# Patient Record
Sex: Female | Born: 1980 | Race: Black or African American | Hispanic: No | Marital: Single | Smoking: Never smoker
Health system: Southern US, Community
[De-identification: ages and names within clinical notes are randomized; demographics above are authoritative.]

---

## 2004-06-22 ENCOUNTER — Emergency Department (HOSPITAL_COMMUNITY): Admission: EM | Admit: 2004-06-22 | Discharge: 2004-06-22 | Payer: Self-pay | Admitting: Emergency Medicine
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
## 2013-04-11 ENCOUNTER — Encounter (HOSPITAL_COMMUNITY): Payer: Self-pay

## 2013-04-11 ENCOUNTER — Emergency Department (HOSPITAL_COMMUNITY): Payer: No Typology Code available for payment source

## 2013-04-11 ENCOUNTER — Emergency Department (HOSPITAL_COMMUNITY)
Admission: EM | Admit: 2013-04-11 | Discharge: 2013-04-11 | Disposition: A | Discharge status: Home / Self Care | Payer: No Typology Code available for payment source | Attending: Emergency Medicine | Admitting: Emergency Medicine

## 2013-04-11 DIAGNOSIS — S4980XA Other specified injuries of shoulder and upper arm, unspecified arm, initial encounter: Secondary | ICD-10-CM | POA: Insufficient documentation

## 2013-04-11 DIAGNOSIS — S46909A Unspecified injury of unspecified muscle, fascia and tendon at shoulder and upper arm level, unspecified arm, initial encounter: Secondary | ICD-10-CM | POA: Insufficient documentation

## 2013-04-11 DIAGNOSIS — Y9389 Activity, other specified: Secondary | ICD-10-CM | POA: Insufficient documentation

## 2013-04-11 DIAGNOSIS — Y92410 Unspecified street and highway as the place of occurrence of the external cause: Secondary | ICD-10-CM | POA: Insufficient documentation

## 2013-04-11 DIAGNOSIS — S8990XA Unspecified injury of unspecified lower leg, initial encounter: Secondary | ICD-10-CM | POA: Insufficient documentation

## 2013-04-11 DIAGNOSIS — S8991XA Unspecified injury of right lower leg, initial encounter: Secondary | ICD-10-CM

## 2013-04-11 DIAGNOSIS — Z880 Allergy status to penicillin: Secondary | ICD-10-CM | POA: Insufficient documentation

## 2013-04-11 MED ORDER — CYCLOBENZAPRINE HCL 5 MG PO TABS: 5.0000 mg | ORAL_TABLET | Freq: Two times a day (BID) | ORAL | Status: DC | PRN

## 2013-04-11 MED ORDER — ONDANSETRON 4 MG PO TBDP
4.0000 mg | ORAL_TABLET | Freq: Once | ORAL | Status: AC
  Administered 2013-04-11: 4 mg via ORAL
  Filled 2013-04-11: qty 1

## 2013-04-11 MED ORDER — HYDROCODONE-ACETAMINOPHEN 5-325 MG PO TABS
2.0000 | ORAL_TABLET | Freq: Once | ORAL | Status: DC
  Filled 2013-04-11: qty 2

## 2013-04-11 MED ORDER — HYDROCODONE-ACETAMINOPHEN 5-325 MG PO TABS
1.0000 | ORAL_TABLET | ORAL | Status: AC
  Administered 2013-04-11: 1 via ORAL

## 2013-04-11 MED ORDER — HYDROCODONE-ACETAMINOPHEN 5-325 MG PO TABS: 1.0000 | ORAL_TABLET | ORAL | Status: DC | PRN
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
## 2013-04-11 NOTE — ED Provider Notes (Signed)
Medical screening examination/treatment/procedure(s) were performed by non-physician practitioner and as supervising physician I was immediately available for consultation/collaboration.   William Maliq Pilley, MD 04/11/13 1829 

## 2013-06-21 ENCOUNTER — Emergency Department (INDEPENDENT_AMBULATORY_CARE_PROVIDER_SITE_OTHER)
Admission: EM | Admit: 2013-06-21 | Discharge: 2013-06-21 | Disposition: A | Discharge status: Home / Self Care | Payer: Self-pay | Source: Home / Self Care | Attending: Emergency Medicine | Admitting: Emergency Medicine

## 2013-06-21 ENCOUNTER — Encounter (HOSPITAL_COMMUNITY): Payer: Self-pay | Admitting: Emergency Medicine

## 2013-06-21 DIAGNOSIS — H10211 Acute toxic conjunctivitis, right eye: Secondary | ICD-10-CM

## 2013-06-21 DIAGNOSIS — H10219 Acute toxic conjunctivitis, unspecified eye: Secondary | ICD-10-CM

## 2013-06-21 MED ORDER — POLYETHYL GLYCOL-PROPYL GLYCOL 0.4-0.3 % OP SOLN: OPHTHALMIC | Status: DC
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
## 2013-06-21 NOTE — ED Notes (Signed)
Accessed notes for patient phone call.

## 2013-06-21 NOTE — ED Provider Notes (Signed)
Chief Complaint:   Chief Complaint  Patient presents with  . Eye Problem    History of Present Illness:   Sandy Weeks is a 32 year old female who splashed some alcohol hand sanitizing gel into both of her eyes yesterday. The right eye got the largest amount of the hand sanitizing gel. She was emptying the hand sanitizing gel from one bottle to the other, when the bottle cracked and it sprayed into her eyes. She noted immediate burning in both of her eyes and there was redness and tearing. This is gradually improved over the past 24 hours. She denies any changes in her vision. She called poison control was told to flush copiously with tap water which she did and apply moist compresses. She feels better today, although her right eye is still mildly irritated. There has been no purulent drainage.  Review of Systems:  Other than noted above, the patient denies any of the following symptoms: Systemic:  No fever, chills, sweats, fatigue, or weight loss. Eye:  No redness, eye pain, photophobia, discharge, blurred vision, or diplopia. ENT:  No nasal congestion, rhinorrhea, or sore throat. Lymphatic:  No adenopathy. Skin:  No rash or pruritis.  PMFSH:  Past medical history, family history, social history, meds, and allergies were reviewed.  She is allergic to penicillin.  Physical Exam:   Vital signs:  BP 115/65  Pulse 68  Temp(Src) 98.8 F (37.1 C) (Oral)  Resp 16  SpO2 100%  LMP 05/22/2013 General:  Alert and in no distress. Eye:  Lids and conjunctiva appear normal. There was no foreign body. No purulent drainage. Cornea was intact to fluorescein staining, anterior chamber was normal. PERRLA, full EOMs, fundi were benign. ENT:  TMs and canals clear.  Nasal mucosa normal.  No intra-oral lesions, mucous membranes moist, pharynx clear. Neck:  No adenopathy tenderness or mass. Skin:  Clear, warm and dry.  Assessment:  The encounter diagnosis was Chemical conjunctivitis of right eye.  Does not  appear to be any damage or injury to the eye, and this should heal up well.  Plan:   1.  Meds:  The following meds were prescribed:   Discharge Medication List as of 06/21/2013  1:02 PM    START taking these medications   Details  Polyethyl Glycol-Propyl Glycol (SYSTANE) 0.4-0.3 % SOLN 1 drop in right eye every 3 hours while awake, Normal        2.  Patient Education/Counseling:  The patient was given appropriate handouts, self care instructions, and instructed in symptomatic relief.   3.  Follow up:  The patient was told to follow up if no better in 3 to 4 days, if becoming worse in any way, and given some red flag symptoms such as worsening redness, pain, or changes in her vision which would prompt immediate return.  Follow up here as needed. It should be noted that the patient also stated that the x-ray tech who took her vital signs did not apply a thermometer cover when she took her temperature. The patient was told this was not a standard practice, that the employer will be counseled, and that she should watch out for any signs of infectious disease such as upper respiratory symptoms, sore throat, or oral lesions.      Reuben Likes, MD 06/21/13 [phone]

## 2013-06-27 ENCOUNTER — Emergency Department (INDEPENDENT_AMBULATORY_CARE_PROVIDER_SITE_OTHER)
Admission: EM | Admit: 2013-06-27 | Discharge: 2013-06-27 | Disposition: A | Discharge status: Home / Self Care | Payer: Self-pay | Source: Home / Self Care | Attending: Emergency Medicine | Admitting: Emergency Medicine

## 2013-06-27 ENCOUNTER — Telehealth (HOSPITAL_COMMUNITY): Payer: Self-pay

## 2013-06-27 ENCOUNTER — Encounter (HOSPITAL_COMMUNITY): Payer: Self-pay | Admitting: Emergency Medicine

## 2013-06-27 DIAGNOSIS — J069 Acute upper respiratory infection, unspecified: Secondary | ICD-10-CM

## 2013-06-27 DIAGNOSIS — J209 Acute bronchitis, unspecified: Secondary | ICD-10-CM

## 2013-06-27 LAB — POCT RAPID STREP A: Streptococcus, Group A Screen (Direct): NEGATIVE

## 2013-06-27 MED ORDER — NAPROXEN 500 MG PO TABS: 500.0000 mg | ORAL_TABLET | Freq: Two times a day (BID) | ORAL | Status: DC

## 2013-06-27 MED ORDER — BENZONATATE 200 MG PO CAPS: 200.0000 mg | ORAL_CAPSULE | Freq: Three times a day (TID) | ORAL | Status: DC | PRN

## 2013-06-27 MED ORDER — AZITHROMYCIN 250 MG PO TABS: ORAL_TABLET | ORAL | Status: DC

## 2013-06-27 NOTE — ED Provider Notes (Signed)
Chief Complaint:   Chief Complaint  Patient presents with  . URI    History of Present Illness:   Sandy Weeks is a 32 year old female who was here on December 8 because she is by some hand sanitizing gel into her eyes. The eyes were mildly irritated. On eye examination her eyes appeared completely normal. On her visit that night, vital signs were taken. The person who took her temperature, used a thermometer probe without a probe cover. The patient noticed this immediately and reported this to me. It's uncertain whether the person who preceded her had had his temperature taken without the probe cover. The person who took the temperature was informed of this. I told the patient that I did not think there was anything that could be done about it at this point, but she should be on the lookout for any evidence of infection. One to 2 days after her appointment here, she developed cough productive yellow-green sputum, nasal congestion with clear drainage, sinus pressure, sore throat, hoarseness, and she felt warm and nauseated. She denies difficulty breathing, headache, fever, vomiting, or diarrhea. She denies any sores inside her mouth.  Review of Systems:  Other than noted above, the patient denies any of the following symptoms: Systemic:  No fevers, chills, sweats, weight loss or gain, fatigue, or tiredness. Eye:  No redness or discharge. ENT:  No ear pain, drainage, headache, nasal congestion, drainage, sinus pressure, difficulty swallowing, or sore throat. Neck:  No neck pain or swollen glands. Lungs:  No cough, sputum production, hemoptysis, wheezing, chest tightness, shortness of breath or chest pain. GI:  No abdominal pain, nausea, vomiting or diarrhea.  PMFSH:  Past medical history, family history, social history, meds, and allergies were reviewed. She is allergic to penicillin.  Physical Exam:   Vital signs:  BP 128/83  Pulse 102  Temp(Src) 99.5 F (37.5 C) (Oral)  Resp 16  SpO2 100%   LMP 06/06/2013 General:  Alert and oriented.  In no distress.  Skin warm and dry. Eye:  No conjunctival injection or drainage. Lids were normal. ENT:  TMs and canals were normal, without erythema or inflammation.  Nasal mucosa was clear and uncongested, without drainage.  Mucous membranes were moist.  Pharynx was moderately erythematous with no exudate or drainage.  There were no oral ulcerations or lesions. Neck:  Supple, no adenopathy, tenderness or mass. Lungs:  No respiratory distress.  Lungs were clear to auscultation, without wheezes, rales or rhonchi.  Breath sounds were clear and equal bilaterally.  Heart:  Regular rhythm, without gallops, murmers or rubs. Skin:  Clear, warm, and dry, without rash or lesions.  Labs:   Results for orders placed during the hospital encounter of 06/27/13  POCT RAPID STREP A ([facility] ONLY)      Result Value Range   Streptococcus, Group A Screen (Direct) NEGATIVE  NEGATIVE    Assessment:  The primary encounter diagnosis was Viral upper respiratory infection. A diagnosis of Acute bronchitis was also pertinent to this visit.  This viral upper respiratory infection could have been due to exposure to a thermometer probe which was not covered with a plastic probe cover. Alternatively this could be unrelated to that episode and could be due to some other exposure as well. I told the patient that I could not be sure which it was. A safety zone portal was filled out. The person who took her vital signs at her last visit has been counseled about the proper way to  take a temperature. The patient will not be charged for this visit. I encouraged her to return if there any new symptoms or if she should fail to get better within one week.  Plan:   1.  Meds:  The following meds were prescribed:   Discharge Medication List as of 06/27/2013 12:37 PM    START taking these medications   Details  azithromycin (ZITHROMAX Z-PAK) 250 MG tablet Take as directed., Normal     benzonatate (TESSALON) 200 MG capsule Take 1 capsule (200 mg total) by mouth 3 (three) times daily as needed for cough., Starting 06/27/2013, Until Discontinued, Normal    naproxen (NAPROSYN) 500 MG tablet Take 1 tablet (500 mg total) by mouth 2 (two) times daily., Starting 06/27/2013, Until Discontinued, Normal        2.  Patient Education/Counseling:  The patient was given appropriate handouts, self care instructions, and instructed in symptomatic relief.   3.  Follow up:  The patient was told to follow up if no better in 3 to 4 days, if becoming worse in any way, and given some red flag symptoms such as fever or worsening symptoms which would prompt immediate return.  Follow up here as necessary.      Reuben Likes, MD 06/27/13 2012
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
## 2013-06-27 NOTE — ED Notes (Signed)
C/o cold sx States she has fever, coughing up yellow mucous, sore throat

## 2013-06-29 LAB — CULTURE, GROUP A STREP

## 2013-07-16 ENCOUNTER — Emergency Department (INDEPENDENT_AMBULATORY_CARE_PROVIDER_SITE_OTHER)
Admission: EM | Admit: 2013-07-16 | Discharge: 2013-07-16 | Disposition: A | Discharge status: Home / Self Care | Payer: Self-pay | Source: Home / Self Care | Attending: Family Medicine | Admitting: Family Medicine

## 2013-07-16 ENCOUNTER — Encounter (HOSPITAL_COMMUNITY): Payer: Self-pay | Admitting: Emergency Medicine

## 2013-07-16 DIAGNOSIS — B349 Viral infection, unspecified: Secondary | ICD-10-CM

## 2013-07-16 DIAGNOSIS — J029 Acute pharyngitis, unspecified: Secondary | ICD-10-CM

## 2013-07-16 DIAGNOSIS — B9789 Other viral agents as the cause of diseases classified elsewhere: Secondary | ICD-10-CM

## 2013-07-16 LAB — POCT RAPID STREP A: Streptococcus, Group A Screen (Direct): NEGATIVE

## 2013-07-16 NOTE — ED Provider Notes (Signed)
CSN: [id number]     Arrival date & time 07/16/13  1230 History   First MD Initiated Contact with Patient 07/16/13 1429     Chief Complaint  Patient presents with  . Sore Throat   (Consider location/radiation/quality/duration/timing/severity/associated sxs/prior Treatment) Patient is a 33 y.o. female presenting with URI. The history is provided by the patient.  URI Presenting symptoms: congestion, fever, rhinorrhea and sore throat   Presenting symptoms: no cough   Severity:  Mild Onset quality:  Gradual Duration:  5 days Progression:  Unchanged Chronicity:  New Ineffective treatments:  OTC medications Associated symptoms: myalgias   Associated symptoms: no swollen glands   Risk factors: sick contacts     History reviewed. No pertinent past medical history. History reviewed. No pertinent past surgical history. History reviewed. No pertinent family history. History  Substance Use Topics  . Smoking status: Never Smoker   . Smokeless tobacco: Not on file  . Alcohol Use: Yes     Comment: occassional   OB History   Grav Para Term Preterm Abortions TAB SAB Ect Mult Living                 Review of Systems  Constitutional: Positive for fever.  HENT: Positive for congestion, rhinorrhea and sore throat.   Respiratory: Negative for cough.   Musculoskeletal: Positive for myalgias.    Allergies  Penicillins  Home Medications   Current Outpatient Rx  Name  Route  Sig  Dispense  Refill  . azithromycin (ZITHROMAX Z-PAK) 250 MG tablet      Take as directed.   6 tablet   0   . benzonatate (TESSALON) 200 MG capsule   Oral   Take 1 capsule (200 mg total) by mouth 3 (three) times daily as needed for cough.   30 capsule   0   . cyclobenzaprine (FLEXERIL) 5 MG tablet   Oral   Take 1 tablet (5 mg total) by mouth 2 (two) times daily as needed for muscle spasms.   20 tablet   0   . HYDROcodone-acetaminophen (NORCO/VICODIN) 5-325 MG per tablet   Oral   Take 1 tablet by  mouth every 4 (four) hours as needed for pain.   12 tablet   0   . naproxen (NAPROSYN) 500 MG tablet   Oral   Take 1 tablet (500 mg total) by mouth 2 (two) times daily.   30 tablet   0   . Polyethyl Glycol-Propyl Glycol (SYSTANE) 0.4-0.3 % SOLN      1 drop in right eye every 3 hours while awake   19 mL   0    BP 119/82  Pulse 82  Temp(Src) 97.8 F (36.6 C) (Oral)  Resp 14  SpO2 100%  LMP 06/06/2013 Physical Exam  Nursing note and vitals reviewed. Constitutional: She is oriented to person, place, and time. Vital signs are normal. She appears well-developed and well-nourished.  HENT:  Head: Normocephalic.  Right Ear: External ear normal.  Left Ear: External ear normal.  Mouth/Throat: Oropharynx is clear and moist.    Eyes: Pupils are equal, round, and reactive to light.  Neck: Normal range of motion. Neck supple.  Pulmonary/Chest: Effort normal and breath sounds normal.  Lymphadenopathy:    She has no cervical adenopathy.  Neurological: She is alert and oriented to person, place, and time.  Skin: Skin is warm and dry.    ED Course  Procedures (including critical care time) Labs Review Labs Reviewed  POCT RAPID STREP  A ([facility] ONLY)   Imaging Review No results found.  EKG Interpretation    Date/Time:    Ventricular Rate:    PR Interval:    QRS Duration:   QT Interval:    QTC Calculation:   R Axis:     Text Interpretation:              MDM      Linna Hoff, MD 07/16/13 [phone]

## 2013-07-16 NOTE — Discharge Instructions (Signed)
Drink plenty of fluids as discussed,  and mucinex or delsym for cough. Return or see your doctor if further problems °
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
## 2013-07-18 LAB — CULTURE, GROUP A STREP
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
## 2020-04-17 ENCOUNTER — Other Ambulatory Visit: Payer: Self-pay

## 2020-11-08 ENCOUNTER — Encounter (HOSPITAL_COMMUNITY): Payer: Self-pay

## 2020-11-08 ENCOUNTER — Other Ambulatory Visit: Payer: Self-pay

## 2020-11-08 ENCOUNTER — Ambulatory Visit (HOSPITAL_COMMUNITY)
Admission: EM | Admit: 2020-11-08 | Discharge: 2020-11-08 | Disposition: A | Discharge status: Home / Self Care | Payer: Self-pay | Attending: Medical Oncology | Admitting: Medical Oncology

## 2020-11-08 DIAGNOSIS — J069 Acute upper respiratory infection, unspecified: Secondary | ICD-10-CM

## 2020-11-08 MED ORDER — BENZONATATE 100 MG PO CAPS: 100.0000 mg | ORAL_CAPSULE | Freq: Three times a day (TID) | ORAL | 0 refills | Status: DC

## 2020-11-08 MED ORDER — FLUTICASONE PROPIONATE 50 MCG/ACT NA SUSP: 2.0000 | Freq: Every day | NASAL | 0 refills | Status: DC

## 2020-11-08 NOTE — ED Triage Notes (Signed)
Pt c/o coughing, sneezing, nasal drainage, fever, loss of appetite and ringing in the ears X 6 days. Pt states she had a fever for 5 days.

## 2020-11-08 NOTE — ED Provider Notes (Signed)
[facility]    CSN: [id number] Arrival date & time: 11/08/20  1739      History   Chief Complaint Chief Complaint  Patient presents with  . Cough  . Nasal Congestion  . Fever    HPI Sapna Padron is a 40 y.o. female.   HPI  Cough: Patient reports symptoms of coughing, sneezing, nasal drainage, fever, loss of appetite and some occasional tinnitus in the ears for the past 6 days.  She states that she has had fevers for 5 days that have resolved finally. No chest pains, SOB, loss of taste/smell, diarrhea. Has had loss of appetite and a few speckles of blood in mucous after coughing fits but has not had any within the last 24 hours and is a non-smoker. She denies any known sick contacts. She reports that she needs COVID-19 testing for her return to work.    History reviewed. No pertinent past medical history.  There are no problems to display for this patient.   History reviewed. No pertinent surgical history.  OB History   No obstetric history on file.      Home Medications    Prior to Admission medications   Medication Sig Start Date End Date Taking? Authorizing Provider  azithromycin (ZITHROMAX Z-PAK) 250 MG tablet Take as directed. 06/27/13   Reuben Likes, MD  benzonatate (TESSALON) 200 MG capsule Take 1 capsule (200 mg total) by mouth 3 (three) times daily as needed for cough. 06/27/13   Reuben Likes, MD  cyclobenzaprine (FLEXERIL) 5 MG tablet Take 1 tablet (5 mg total) by mouth 2 (two) times daily as needed for muscle spasms. 04/11/13   Marlon Pel, PA-C  HYDROcodone-acetaminophen (NORCO/VICODIN) 5-325 MG per tablet Take 1 tablet by mouth every 4 (four) hours as needed for pain. 04/11/13   Marlon Pel, PA-C  naproxen (NAPROSYN) 500 MG tablet Take 1 tablet (500 mg total) by mouth 2 (two) times daily. 06/27/13   Reuben Likes, MD  Polyethyl Glycol-Propyl Glycol (SYSTANE) 0.4-0.3 % SOLN 1 drop in right eye every 3 hours while awake 06/21/13    Reuben Likes, MD    Family History History reviewed. No pertinent family history.  Social History Social History   Tobacco Use  . Smoking status: Never Smoker  . Smokeless tobacco: Never Used  Substance Use Topics  . Alcohol use: Yes    Comment: occassional  . Drug use: No     Allergies   Penicillins   Review of Systems Review of Systems  As stated above in HPI Physical Exam Triage Vital Signs ED Triage Vitals  Enc Vitals Group     BP 11/08/20 1834 109/62     Pulse Rate 11/08/20 1834 83     Resp 11/08/20 1834 18     Temp 11/08/20 1834 98.1 F (36.7 C)     Temp Source 11/08/20 1834 Oral     SpO2 11/08/20 1834 100 %     Weight --      Height --      Head Circumference --      Peak Flow --      Pain Score 11/08/20 1831 6     Pain Loc --      Pain Edu? --      Excl. in GC? --    No data found.  Updated Vital Signs BP 109/62 (BP Location: Right Arm)   Pulse 83   Temp 98.1 F (36.7 C) (Oral)   Resp  18   LMP 11/08/2020 (Exact Date)   SpO2 100%   Physical Exam Vitals and nursing note reviewed.  Constitutional:      General: She is not in acute distress.    Appearance: Normal appearance. She is not ill-appearing, toxic-appearing or diaphoretic.  HENT:     Head: Normocephalic and atraumatic.     Right Ear: Tympanic membrane, ear canal and external ear normal.     Left Ear: Tympanic membrane, ear canal and external ear normal.     Nose: Rhinorrhea present.     Mouth/Throat:     Mouth: Mucous membranes are dry.     Pharynx: No oropharyngeal exudate or posterior oropharyngeal erythema.  Eyes:     Extraocular Movements: Extraocular movements intact.     Pupils: Pupils are equal, round, and reactive to light.  Cardiovascular:     Rate and Rhythm: Normal rate and regular rhythm.     Pulses: Normal pulses.     Heart sounds: Normal heart sounds.  Pulmonary:     Effort: Pulmonary effort is normal.     Breath sounds: Normal breath sounds.  Abdominal:      General: Abdomen is flat. Bowel sounds are normal. There is no distension.     Palpations: Abdomen is soft. There is no mass.     Tenderness: There is no abdominal tenderness. There is no guarding or rebound.     Hernia: No hernia is present.  Musculoskeletal:     Cervical back: Normal range of motion and neck supple. No rigidity.  Neurological:     Mental Status: She is alert and oriented to person, place, and time.      UC Treatments / Results  Labs (all labs ordered are listed, but only abnormal results are displayed) Labs Reviewed - No data to display  EKG   Radiology No results found.  Procedures Procedures (including critical care time)  Medications Ordered in UC Medications - No data to display  Initial Impression / Assessment and Plan / UC Course  I have reviewed the triage vital signs and the nursing notes.  Pertinent labs & imaging results that were available during my care of the patient were reviewed by me and considered in my medical decision making (see chart for details).     New.  Likely viral in nature although we did discuss red flag signs and symptoms.  Treating her with Flonase and Tessalon.  COVID swab per patient request. Final Clinical Impressions(s) / UC Diagnoses   Final diagnoses:  None   Discharge Instructions   None    ED Prescriptions    None     PDMP not reviewed this encounter.   Rushie Chestnut, [place] 11/08/20 1942

## 2020-11-08 NOTE — ED Notes (Signed)
Provided blankets 

## 2022-05-06 ENCOUNTER — Other Ambulatory Visit (HOSPITAL_BASED_OUTPATIENT_CLINIC_OR_DEPARTMENT_OTHER): Payer: Self-pay

## 2023-06-28 ENCOUNTER — Ambulatory Visit (HOSPITAL_COMMUNITY)
Admission: EM | Admit: 2023-06-28 | Discharge: 2023-06-28 | Disposition: A | Discharge status: Home / Self Care | Payer: Medicaid Other | Attending: Emergency Medicine | Admitting: Emergency Medicine

## 2023-06-28 ENCOUNTER — Encounter (HOSPITAL_COMMUNITY): Payer: Self-pay

## 2023-06-28 DIAGNOSIS — R300 Dysuria: Secondary | ICD-10-CM | POA: Diagnosis present

## 2023-06-28 LAB — POCT URINALYSIS DIP (MANUAL ENTRY)
Bilirubin, UA: NEGATIVE
Blood, UA: NEGATIVE
Glucose, UA: NEGATIVE mg/dL
Ketones, POC UA: NEGATIVE mg/dL
Nitrite, UA: NEGATIVE
Protein Ur, POC: NEGATIVE mg/dL
Spec Grav, UA: 1.015 (ref 1.010–1.025)
Urobilinogen, UA: 0.2 U/dL
pH, UA: 6.5 (ref 5.0–8.0)

## 2023-06-28 NOTE — ED Triage Notes (Signed)
Pt presents to the office for Dysuria x 3 days. Pt states she has been drinking a lot of fluids to flush her kidney out.

## 2023-06-28 NOTE — ED Provider Notes (Signed)
[facility]    CSN: [id number] Arrival date & time: 06/28/23  1513      History   Chief Complaint Chief Complaint  Patient presents with   Dysuria    HPI Advitha Finton is a 42 y.o. female.  2 to 3 day history of dysuria with suprapubic discomfort.  Reports she was drinking lots of fluids and symptoms have pretty much resolved.  She wanted to be checked for a UTI and was worried about a kidney infection. Not having abdominal pain, nausea vomiting, fever, flank pain  History reviewed. No pertinent past medical history.  There are no active problems to display for this patient.   History reviewed. No pertinent surgical history.  OB History   No obstetric history on file.      Home Medications    Prior to Admission medications   Medication Sig Start Date End Date Taking? Authorizing Provider  azithromycin (ZITHROMAX Z-PAK) 250 MG tablet Take as directed. 06/27/13   Reuben Likes, MD  benzonatate (TESSALON) 100 MG capsule Take 1 capsule (100 mg total) by mouth every 8 (eight) hours. 11/08/20   Rushie Chestnut, PA-C  cyclobenzaprine (FLEXERIL) 5 MG tablet Take 1 tablet (5 mg total) by mouth 2 (two) times daily as needed for muscle spasms. 04/11/13   Marlon Pel, PA-C  fluticasone (FLONASE) 50 MCG/ACT nasal spray Place 2 sprays into both nostrils daily. 11/08/20   Rushie Chestnut, PA-C  HYDROcodone-acetaminophen (NORCO/VICODIN) 5-325 MG per tablet Take 1 tablet by mouth every 4 (four) hours as needed for pain. 04/11/13   Marlon Pel, PA-C  naproxen (NAPROSYN) 500 MG tablet Take 1 tablet (500 mg total) by mouth 2 (two) times daily. 06/27/13   Reuben Likes, MD  Polyethyl Glycol-Propyl Glycol (SYSTANE) 0.4-0.3 % SOLN 1 drop in right eye every 3 hours while awake 06/21/13   Reuben Likes, MD    Family History No family history on file.  Social History Social History   Tobacco Use   Smoking status: Never   Smokeless tobacco: Never  Substance Use  Topics   Alcohol use: Yes    Comment: occassional   Drug use: No     Allergies   Penicillins   Review of Systems Review of Systems  Genitourinary:  Positive for dysuria.   Per HPI  Physical Exam Triage Vital Signs ED Triage Vitals [06/28/23 1559]  Encounter Vitals Group     BP 136/75     Systolic BP Percentile      Diastolic BP Percentile      Pulse Rate 85     Resp 16     Temp 98.1 F (36.7 C)     Temp Source Oral     SpO2 98 %     Weight      Height      Head Circumference      Peak Flow      Pain Score      Pain Loc      Pain Education      Exclude from Growth Chart    No data found.  Updated Vital Signs BP 136/75 (BP Location: Left Arm)   Pulse 85   Temp 98.1 F (36.7 C) (Oral)   Resp 16   LMP 06/26/2023 (Approximate)   SpO2 98%   Physical Exam Vitals and nursing note reviewed.  Constitutional:      General: She is not in acute distress. HENT:     Mouth/Throat:  Mouth: Mucous membranes are moist.     Pharynx: Oropharynx is clear.  Eyes:     Conjunctiva/sclera: Conjunctivae normal.     Pupils: Pupils are equal, round, and reactive to light.  Cardiovascular:     Rate and Rhythm: Normal rate and regular rhythm.     Heart sounds: Normal heart sounds.  Pulmonary:     Effort: Pulmonary effort is normal.     Breath sounds: Normal breath sounds.  Abdominal:     General: Bowel sounds are normal.     Palpations: Abdomen is soft.     Tenderness: There is no abdominal tenderness. There is no right CVA tenderness, left CVA tenderness or guarding.  Neurological:     Mental Status: She is alert and oriented to person, place, and time.      UC Treatments / Results  Labs (all labs ordered are listed, but only abnormal results are displayed) Labs Reviewed  POCT URINALYSIS DIP (MANUAL ENTRY) - Abnormal; Notable for the following components:      Result Value   Clarity, UA hazy (*)    Leukocytes, UA Trace (*)    All other components within  normal limits  URINE CULTURE    EKG   Radiology No results found.  Procedures Procedures (including critical care time)  Medications Ordered in UC Medications - No data to display  Initial Impression / Assessment and Plan / UC Course  I have reviewed the triage vital signs and the nursing notes.  Pertinent labs & imaging results that were available during my care of the patient were reviewed by me and considered in my medical decision making (see chart for details).  UA trace leuks.  Will culture to confirm but symptoms have overall resolved.  She is afebrile and well-appearing.  Advised continuing fluid intake.  Will update if culture requires change in therapy.  Advised monitoring symptoms, can return if needed.  Patient is agreeable to plan  Final Clinical Impressions(s) / UC Diagnoses   Final diagnoses:  Dysuria     Discharge Instructions      We will call you if anything on urine culture requires a change in therapy (about 1-3 days)  Keep drinking lots of fluids  Monitor your symptoms and please return with any changes or concerns     ED Prescriptions   None    PDMP not reviewed this encounter.   Marlow Baars, [place] 06/28/23 1744

## 2023-06-28 NOTE — Discharge Instructions (Signed)
We will call you if anything on urine culture requires a change in therapy (about 1-3 days)  Keep drinking lots of fluids  Monitor your symptoms and please return with any changes or concerns

## 2023-06-29 LAB — URINE CULTURE: Culture: <10000 — AB

## 2023-07-07 ENCOUNTER — Other Ambulatory Visit: Payer: Self-pay

## 2023-07-07 ENCOUNTER — Encounter (HOSPITAL_COMMUNITY): Payer: Self-pay | Admitting: *Deleted

## 2023-07-07 ENCOUNTER — Ambulatory Visit (HOSPITAL_COMMUNITY)
Admission: EM | Admit: 2023-07-07 | Discharge: 2023-07-07 | Disposition: A | Discharge status: Home / Self Care | Payer: Medicaid Other | Attending: Internal Medicine | Admitting: Internal Medicine

## 2023-07-07 DIAGNOSIS — R300 Dysuria: Secondary | ICD-10-CM | POA: Diagnosis present

## 2023-07-07 LAB — POCT URINALYSIS DIP (MANUAL ENTRY)
Bilirubin, UA: NEGATIVE
Glucose, UA: NEGATIVE mg/dL
Nitrite, UA: NEGATIVE
Protein Ur, POC: 30 mg/dL — AB
Spec Grav, UA: >=1.03 — AB (ref 1.010–1.025)
Urobilinogen, UA: 0.2 U/dL
pH, UA: 5.5 (ref 5.0–8.0)

## 2023-07-07 MED ORDER — SULFAMETHOXAZOLE-TRIMETHOPRIM 800-160 MG PO TABS: 1.0000 | ORAL_TABLET | Freq: Two times a day (BID) | ORAL | 0 refills | Status: AC

## 2023-07-07 NOTE — ED Triage Notes (Signed)
PT seen for same on 06-28-23. Pt continues to have Sx's .

## 2023-07-07 NOTE — Discharge Instructions (Addendum)
I am treating you for urinary tract infection Take the Bactrim as prescribed - every 12 hours for 3 days in a row We will call you if anything on your vaginal swab returns positive

## 2023-07-07 NOTE — ED Provider Notes (Signed)
[facility]    CSN: [id number] Arrival date & time: 07/07/23  1243     History   Chief Complaint Chief Complaint  Patient presents with   UTI    HPI Sandy Weeks is a 42 y.o. female.  Returns with continued dysuria, suprapubic discomfort Seen by this provider on 12/14, at that time symptoms had almost resolved on their own. Culture with insignificant growth. Patient here today because symptoms returned and are making her very uncomfortable.  Some vaginal odor but no discharge. No concern for STD exposure  LMP 12/12  History reviewed. No pertinent past medical history.  There are no active problems to display for this patient.   History reviewed. No pertinent surgical history.  OB History   No obstetric history on file.      Home Medications    Prior to Admission medications   Medication Sig Start Date End Date Taking? Authorizing Provider  sulfamethoxazole-trimethoprim (BACTRIM DS) 800-160 MG tablet Take 1 tablet by mouth 2 (two) times daily for 3 days. 07/07/23 07/10/23 Yes Adel Neyer, Ray Church    Family History History reviewed. No pertinent family history.  Social History Social History   Tobacco Use   Smoking status: Never   Smokeless tobacco: Never  Substance Use Topics   Alcohol use: Yes    Comment: occassional   Drug use: No     Allergies   Penicillins   Review of Systems Review of Systems Per HPI  Physical Exam Triage Vital Signs ED Triage Vitals  Encounter Vitals Group     BP 07/07/23 1335 111/79     Systolic BP Percentile --      Diastolic BP Percentile --      Pulse Rate 07/07/23 1335 84     Resp 07/07/23 1335 16     Temp 07/07/23 1335 98.4 F (36.9 C)     Temp src --      SpO2 07/07/23 1335 98 %     Weight --      Height --      Head Circumference --      Peak Flow --      Pain Score 07/07/23 1332 4     Pain Loc --      Pain Education --      Exclude from Growth Chart --    No data found.  Updated Vital  Signs BP 111/79   Pulse 84   Temp 98.4 F (36.9 C)   Resp 16   LMP 06/26/2023 (Approximate)   SpO2 98%   Physical Exam Vitals and nursing note reviewed.  Constitutional:      General: She is not in acute distress. HENT:     Mouth/Throat:     Mouth: Mucous membranes are moist.     Pharynx: Oropharynx is clear.  Eyes:     Conjunctiva/sclera: Conjunctivae normal.  Cardiovascular:     Rate and Rhythm: Normal rate and regular rhythm.     Heart sounds: Normal heart sounds.  Pulmonary:     Effort: Pulmonary effort is normal.     Breath sounds: Normal breath sounds.  Abdominal:     General: Bowel sounds are normal.     Palpations: Abdomen is soft.     Tenderness: There is no abdominal tenderness. There is no right CVA tenderness or left CVA tenderness.  Neurological:     Mental Status: She is alert and oriented to person, place, and time.     UC Treatments / Results  Labs (all labs ordered are listed, but only abnormal results are displayed) Labs Reviewed  POCT URINALYSIS DIP (MANUAL ENTRY) - Abnormal; Notable for the following components:      Result Value   Color, UA straw (*)    Clarity, UA cloudy (*)    Ketones, POC UA small (15) (*)    Spec Grav, UA >=1.030 (*)    Blood, UA small (*)    Protein Ur, POC =30 (*)    Leukocytes, UA Small (1+) (*)    All other components within normal limits  URINE CULTURE  CERVICOVAGINAL ANCILLARY ONLY    EKG  Radiology No results found.  Procedures Procedures   Medications Ordered in UC Medications - No data to display  Initial Impression / Assessment and Plan / UC Course  I have reviewed the triage vital signs and the nursing notes.  Pertinent labs & imaging results that were available during my care of the patient were reviewed by me and considered in my medical decision making (see chart for details).  UA small leuks and RBC Elevated spec grav. Reports has been drinking lots of water. Culture pending Will treat for  acute cystitis with bactrim BID x 3 Also recommend vaginal swab for BV and yeast. Patient without concern for STD. Advised close follow with PCP  Final Clinical Impressions(s) / UC Diagnoses   Final diagnoses:  Dysuria     Discharge Instructions      I am treating you for urinary tract infection Take the Bactrim as prescribed - every 12 hours for 3 days in a row We will call you if anything on your vaginal swab returns positive    ED Prescriptions     Medication Sig Dispense Auth. Provider   sulfamethoxazole-trimethoprim (BACTRIM DS) 800-160 MG tablet Take 1 tablet by mouth 2 (two) times daily for 3 days. 6 tablet Charly Holcomb, Lurena Joiner, PA-C      PDMP not reviewed this encounter.   Marlow Baars, [place] 07/07/23 1501

## 2023-07-08 LAB — CERVICOVAGINAL ANCILLARY ONLY
Bacterial Vaginitis (gardnerella): NEGATIVE
Candida Glabrata: NEGATIVE
Candida Vaginitis: NEGATIVE
Comment: NEGATIVE
Comment: NEGATIVE
Comment: NEGATIVE

## 2023-07-08 LAB — URINE CULTURE: Culture: <10000 — AB
